# Patient Record
Sex: Female | Born: 2000 | Race: Black or African American | Hispanic: No | Marital: Single | Smoking: Never smoker
Health system: Southern US, Community
[De-identification: ages and names within clinical notes are randomized; demographics above are authoritative.]

## PROBLEM LIST (undated history)

## (undated) DIAGNOSIS — J45909 Unspecified asthma, uncomplicated: Secondary | ICD-10-CM

---
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
## 2020-01-31 ENCOUNTER — Other Ambulatory Visit: Payer: Self-pay

## 2020-01-31 ENCOUNTER — Emergency Department
Admission: EM | Admit: 2020-01-31 | Discharge: 2020-01-31 | Disposition: A | Discharge status: Home / Self Care | Payer: HRSA Program | Attending: Emergency Medicine | Admitting: Emergency Medicine

## 2020-01-31 ENCOUNTER — Encounter: Payer: Self-pay | Admitting: Emergency Medicine

## 2020-01-31 DIAGNOSIS — R519 Headache, unspecified: Secondary | ICD-10-CM | POA: Diagnosis present

## 2020-01-31 DIAGNOSIS — U071 COVID-19: Secondary | ICD-10-CM | POA: Insufficient documentation

## 2020-01-31 DIAGNOSIS — J45909 Unspecified asthma, uncomplicated: Secondary | ICD-10-CM | POA: Diagnosis not present

## 2020-01-31 LAB — RESP PANEL BY RT-PCR (FLU A&B, COVID) ARPGX2
Influenza A by PCR: NEGATIVE
Influenza B by PCR: NEGATIVE
SARS Coronavirus 2 by RT PCR: POSITIVE — AB

## 2020-01-31 NOTE — ED Notes (Signed)
Lab called POS covid result

## 2020-01-31 NOTE — ED Triage Notes (Signed)
States just wanted to come for a covid test.  C/O headache, back pain, cough, congestion, chills x 4

## 2020-01-31 NOTE — ED Notes (Signed)
Pt in with c/o body ache to only her back per pt, coughing, congestion, HA. Reports vomiting once today.  Denies diarrhea but reports "having the runs". Denies fever and SOB.
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
## 2020-02-01 ENCOUNTER — Telehealth: Payer: Self-pay | Admitting: Nurse Practitioner

## 2020-02-01 NOTE — Telephone Encounter (Signed)
Called to Discuss with patient about Covid symptoms and the use of the monoclonal antibody infusion for those with mild to moderate Covid symptoms and at a high risk of hospitalization.     Pt appears to qualify for this infusion due to co-morbid conditions and/or a member of an at-risk group in accordance with the FDA Emergency Use Authorization. BMI, asthma, high SVI    Unable to reach pt. Voicemail left and My Chart message sent.   Symptom onset: 12/25 Vaccinated: No  Qualified for Infusion: Yes  Willette Alma, NP [facility]  [phone]

## 2020-03-26 ENCOUNTER — Encounter: Payer: Self-pay | Admitting: Physician Assistant

## 2020-04-12 ENCOUNTER — Encounter: Payer: Self-pay | Admitting: Emergency Medicine

## 2020-04-12 ENCOUNTER — Emergency Department
Admission: EM | Admit: 2020-04-12 | Discharge: 2020-04-12 | Disposition: A | Discharge status: Home / Self Care | Payer: Self-pay | Attending: Emergency Medicine | Admitting: Emergency Medicine

## 2020-04-12 ENCOUNTER — Other Ambulatory Visit: Payer: Self-pay

## 2020-04-12 DIAGNOSIS — J45909 Unspecified asthma, uncomplicated: Secondary | ICD-10-CM | POA: Insufficient documentation

## 2020-04-12 DIAGNOSIS — J029 Acute pharyngitis, unspecified: Secondary | ICD-10-CM | POA: Insufficient documentation

## 2020-04-12 LAB — GROUP A STREP BY PCR: Group A Strep by PCR: NOT DETECTED

## 2020-04-12 MED ORDER — LIDOCAINE VISCOUS HCL 2 % MT SOLN
15.0000 mL | Freq: Once | OROMUCOSAL | Status: AC
  Administered 2020-04-12: 15 mL via OROMUCOSAL
  Filled 2020-04-12: qty 15

## 2020-04-12 MED ORDER — NYSTATIN 100000 UNIT/ML MT SUSP: 5.0000 mL | Freq: Four times a day (QID) | OROMUCOSAL | 0 refills | Status: DC | PRN
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
## 2020-04-12 NOTE — ED Triage Notes (Signed)
Pt comes into the ED via POV c/o sore throat x 2 days.  Pt has enlarged tonsils at this time.  Pt in NAD with even and unlabored respirations.

## 2020-04-12 NOTE — Discharge Instructions (Addendum)
Take OTC Zyrtec along with Sudafed. Use the magic mouthwash as needed. Follow-up with [facility].

## 2020-05-10 ENCOUNTER — Ambulatory Visit: Payer: Self-pay

## 2020-07-10 ENCOUNTER — Ambulatory Visit: Payer: Self-pay

## 2020-07-25 ENCOUNTER — Emergency Department
Admission: EM | Admit: 2020-07-25 | Discharge: 2020-07-25 | Disposition: A | Discharge status: Home / Self Care | Payer: Self-pay | Attending: Emergency Medicine | Admitting: Emergency Medicine

## 2020-07-25 ENCOUNTER — Other Ambulatory Visit: Payer: Self-pay

## 2020-07-25 ENCOUNTER — Encounter: Payer: Self-pay | Admitting: Emergency Medicine

## 2020-07-25 DIAGNOSIS — R21 Rash and other nonspecific skin eruption: Secondary | ICD-10-CM | POA: Insufficient documentation

## 2020-07-25 DIAGNOSIS — J45909 Unspecified asthma, uncomplicated: Secondary | ICD-10-CM | POA: Insufficient documentation

## 2020-07-25 MED ORDER — HYDROXYZINE HCL 50 MG PO TABS: 50.0000 mg | ORAL_TABLET | Freq: Three times a day (TID) | ORAL | 0 refills | Status: DC | PRN

## 2020-07-25 MED ORDER — METHYLPREDNISOLONE 4 MG PO TBPK: ORAL_TABLET | ORAL | 0 refills | Status: DC
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
## 2020-07-25 NOTE — ED Triage Notes (Signed)
Presents with rash this am   states she woke up with the rash this am   took benadryl 50 mg PTA

## 2020-07-25 NOTE — Discharge Instructions (Addendum)
Read and follow discharge care instruction.  Take medication as directed. 

## 2020-07-25 NOTE — ED Notes (Signed)
First Nurse Note: Pt c/o rash x 3 days

## 2020-10-18 ENCOUNTER — Other Ambulatory Visit: Payer: Self-pay

## 2020-10-18 ENCOUNTER — Emergency Department
Admission: EM | Admit: 2020-10-18 | Discharge: 2020-10-18 | Disposition: A | Discharge status: Home / Self Care | Payer: Self-pay | Attending: Emergency Medicine | Admitting: Emergency Medicine

## 2020-10-18 ENCOUNTER — Encounter: Payer: Self-pay | Admitting: Emergency Medicine

## 2020-10-18 DIAGNOSIS — M79651 Pain in right thigh: Secondary | ICD-10-CM | POA: Insufficient documentation

## 2020-10-18 DIAGNOSIS — Z5321 Procedure and treatment not carried out due to patient leaving prior to being seen by health care provider: Secondary | ICD-10-CM | POA: Insufficient documentation

## 2020-10-18 NOTE — ED Notes (Signed)
Pt called x 1 to be roomed.  No answer by patient.

## 2020-10-18 NOTE — ED Triage Notes (Signed)
Pt via POV from home. Pt c/o R leg pain. Pt states that it has been mildly bothering her the past 2 weeks but it got worse this AM. Pt states the pain is mostly in her thigh but radiates down her leg. Denies swelling. Pt is A&Ox4 and NAD

## 2020-10-18 NOTE — ED Notes (Signed)
Called patient no answer no answer at this time.

## 2020-11-06 ENCOUNTER — Emergency Department: Payer: Self-pay

## 2020-11-06 ENCOUNTER — Emergency Department
Admission: EM | Admit: 2020-11-06 | Discharge: 2020-11-07 | Disposition: A | Discharge status: Home / Self Care | Payer: Self-pay | Attending: Emergency Medicine | Admitting: Emergency Medicine

## 2020-11-06 ENCOUNTER — Other Ambulatory Visit: Payer: Self-pay

## 2020-11-06 DIAGNOSIS — J45909 Unspecified asthma, uncomplicated: Secondary | ICD-10-CM | POA: Insufficient documentation

## 2020-11-06 DIAGNOSIS — M940 Chondrocostal junction syndrome [Tietze]: Secondary | ICD-10-CM | POA: Insufficient documentation

## 2020-11-06 DIAGNOSIS — E876 Hypokalemia: Secondary | ICD-10-CM | POA: Insufficient documentation

## 2020-11-06 DIAGNOSIS — R079 Chest pain, unspecified: Secondary | ICD-10-CM | POA: Insufficient documentation

## 2020-11-06 LAB — CBC
HCT: 36.2 % (ref 36.0–46.0)
Hemoglobin: 12.5 g/dL (ref 12.0–15.0)
MCH: 30.7 pg (ref 26.0–34.0)
MCHC: 34.5 g/dL (ref 30.0–36.0)
MCV: 88.9 fL (ref 80.0–100.0)
Platelets: 306 10*3/uL (ref 150–400)
RBC: 4.07 MIL/uL (ref 3.87–5.11)
RDW: 13.2 % (ref 11.5–15.5)
WBC: 6.1 10*3/uL (ref 4.0–10.5)
nRBC: 0 % (ref 0.0–0.2)

## 2020-11-06 LAB — COMPREHENSIVE METABOLIC PANEL
ALT: 14 U/L (ref 0–44)
AST: 18 U/L (ref 15–41)
Albumin: 4.2 g/dL (ref 3.5–5.0)
Alkaline Phosphatase: 54 U/L (ref 38–126)
Anion gap: 8 (ref 5–15)
BUN: 13 mg/dL (ref 6–20)
CO2: 24 mmol/L (ref 22–32)
Calcium: 9.3 mg/dL (ref 8.9–10.3)
Chloride: 105 mmol/L (ref 98–111)
Creatinine, Ser: 0.66 mg/dL (ref 0.44–1.00)
GFR, Estimated: >60 mL/min (ref >60)
Glucose, Bld: 95 mg/dL (ref 70–99)
Potassium: 3.2 mmol/L — ABNORMAL LOW (ref 3.5–5.1)
Sodium: 137 mmol/L (ref 135–145)
Total Bilirubin: 0.9 mg/dL (ref 0.3–1.2)
Total Protein: 7.2 g/dL (ref 6.5–8.1)

## 2020-11-06 LAB — TROPONIN I (HIGH SENSITIVITY): Troponin I (High Sensitivity): <2 ng/L (ref <18)

## 2020-11-06 MED ORDER — KETOROLAC TROMETHAMINE 60 MG/2ML IM SOLN
30.0000 mg | Freq: Once | INTRAMUSCULAR | Status: AC
  Administered 2020-11-07: 30 mg via INTRAMUSCULAR
  Filled 2020-11-06: qty 2

## 2020-11-06 MED ORDER — POTASSIUM CHLORIDE CRYS ER 20 MEQ PO TBCR
40.0000 meq | EXTENDED_RELEASE_TABLET | Freq: Once | ORAL | Status: AC
  Administered 2020-11-07: 40 meq via ORAL
  Filled 2020-11-06: qty 2
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
## 2020-11-06 NOTE — ED Triage Notes (Signed)
Pt in with co central chest pain for few months. States it is intermittent but no precipitated by activity or movement. Pt states does radiates through to back at times.

## 2020-11-07 LAB — LIPASE, BLOOD: Lipase: 23 U/L (ref 11–51)

## 2020-11-07 MED ORDER — NAPROXEN 500 MG PO TABS: 500.0000 mg | ORAL_TABLET | Freq: Two times a day (BID) | ORAL | 0 refills | Status: DC

## 2020-11-07 NOTE — Discharge Instructions (Signed)
You may take Naprosyn as needed for chest discomfort.  Apply moist heat to affected area several times daily.  Return to the ER for worsening symptoms, persistent vomiting, difficulty breathing or other concerns.

## 2020-11-29 ENCOUNTER — Ambulatory Visit: Payer: Self-pay

## 2021-02-02 ENCOUNTER — Ambulatory Visit: Payer: Self-pay

## 2022-01-20 IMAGING — CR DG CHEST 2V
1 series · 2 of 2 positions shown · non-contrast
Comparison: 11/07/2015

CLINICAL DATA: Chest pain

EXAM:
CHEST - 2 VIEW

[Series 1: dg chest 2 view · 0.14mm/px · 2 of 2 slices shown]
[im 1/2]
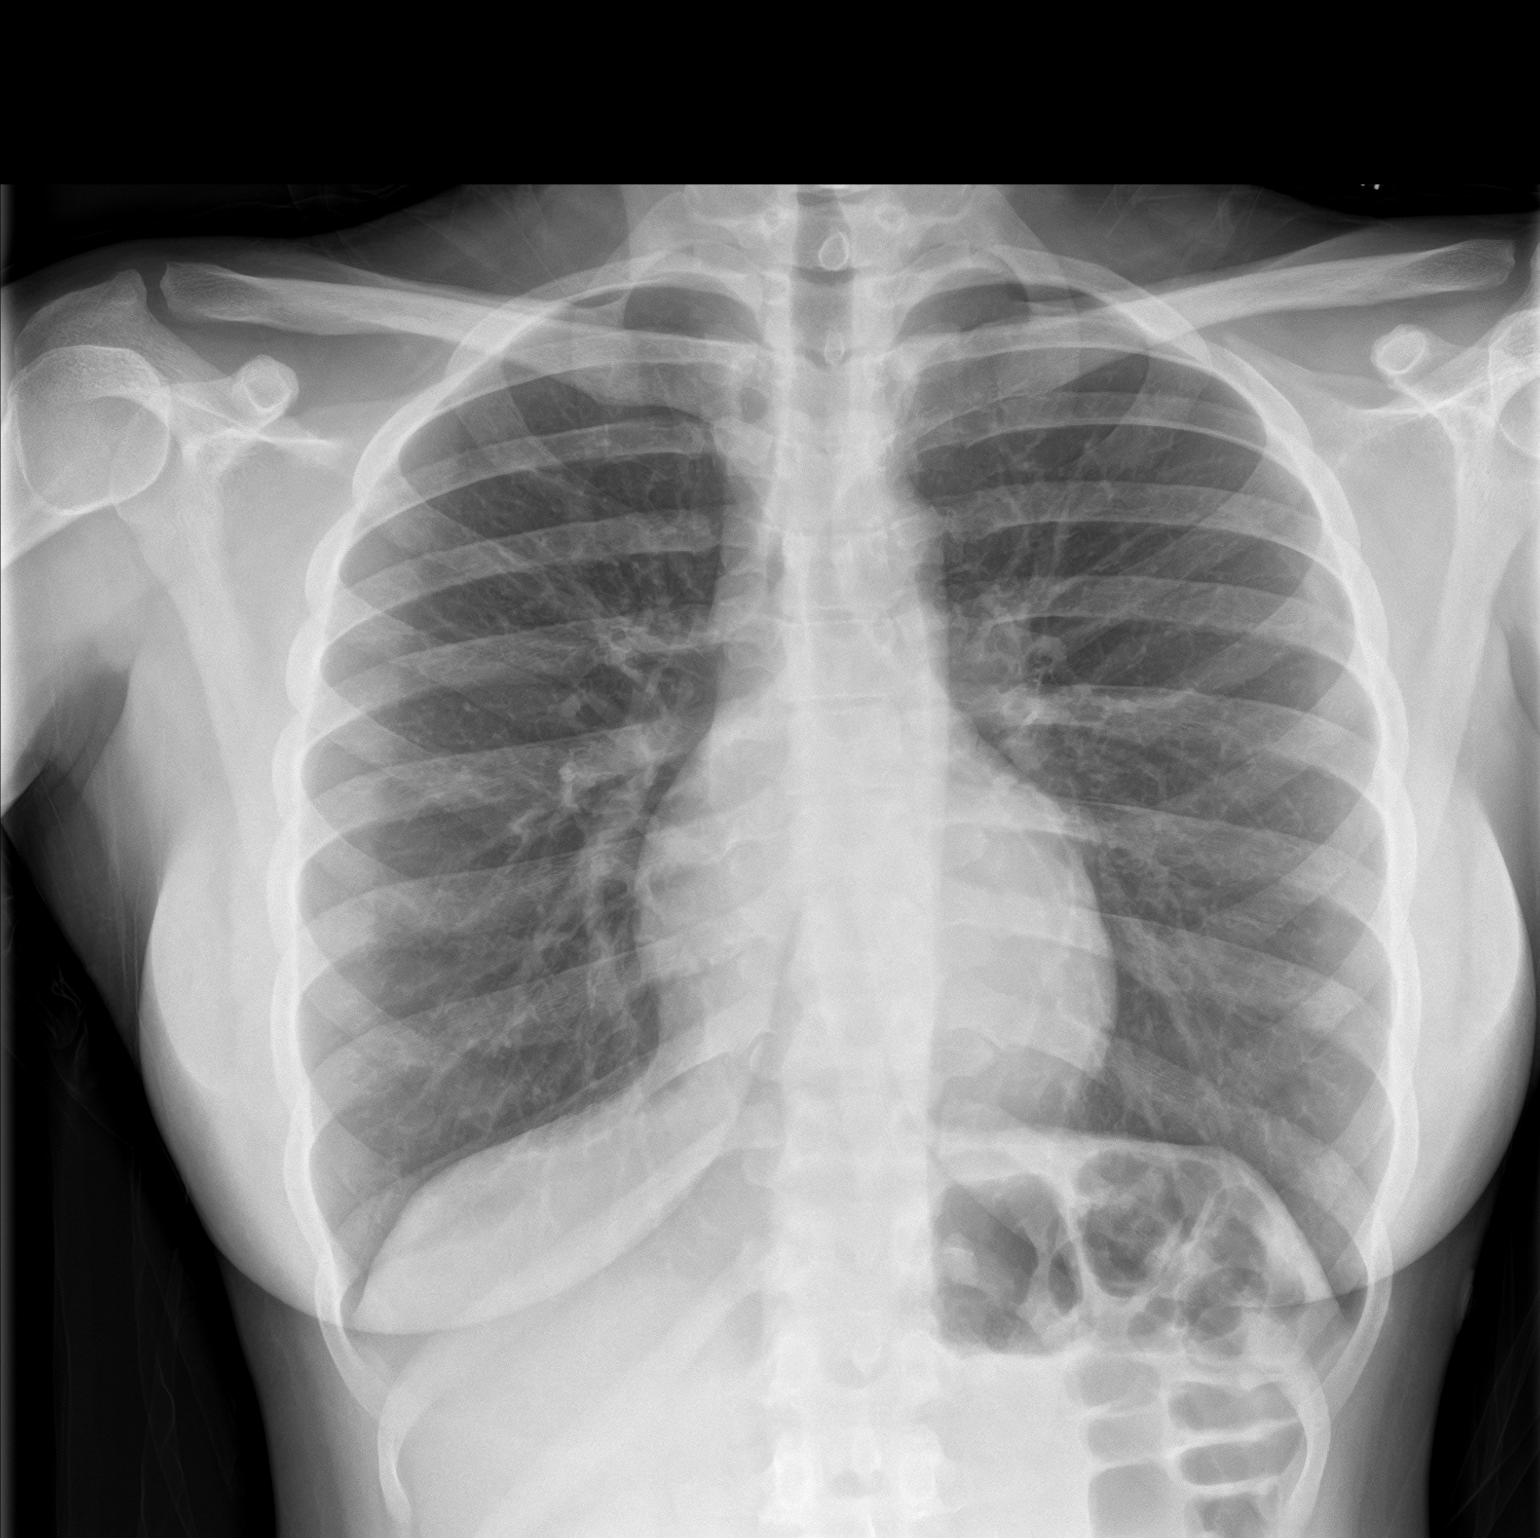
[im 2/2]
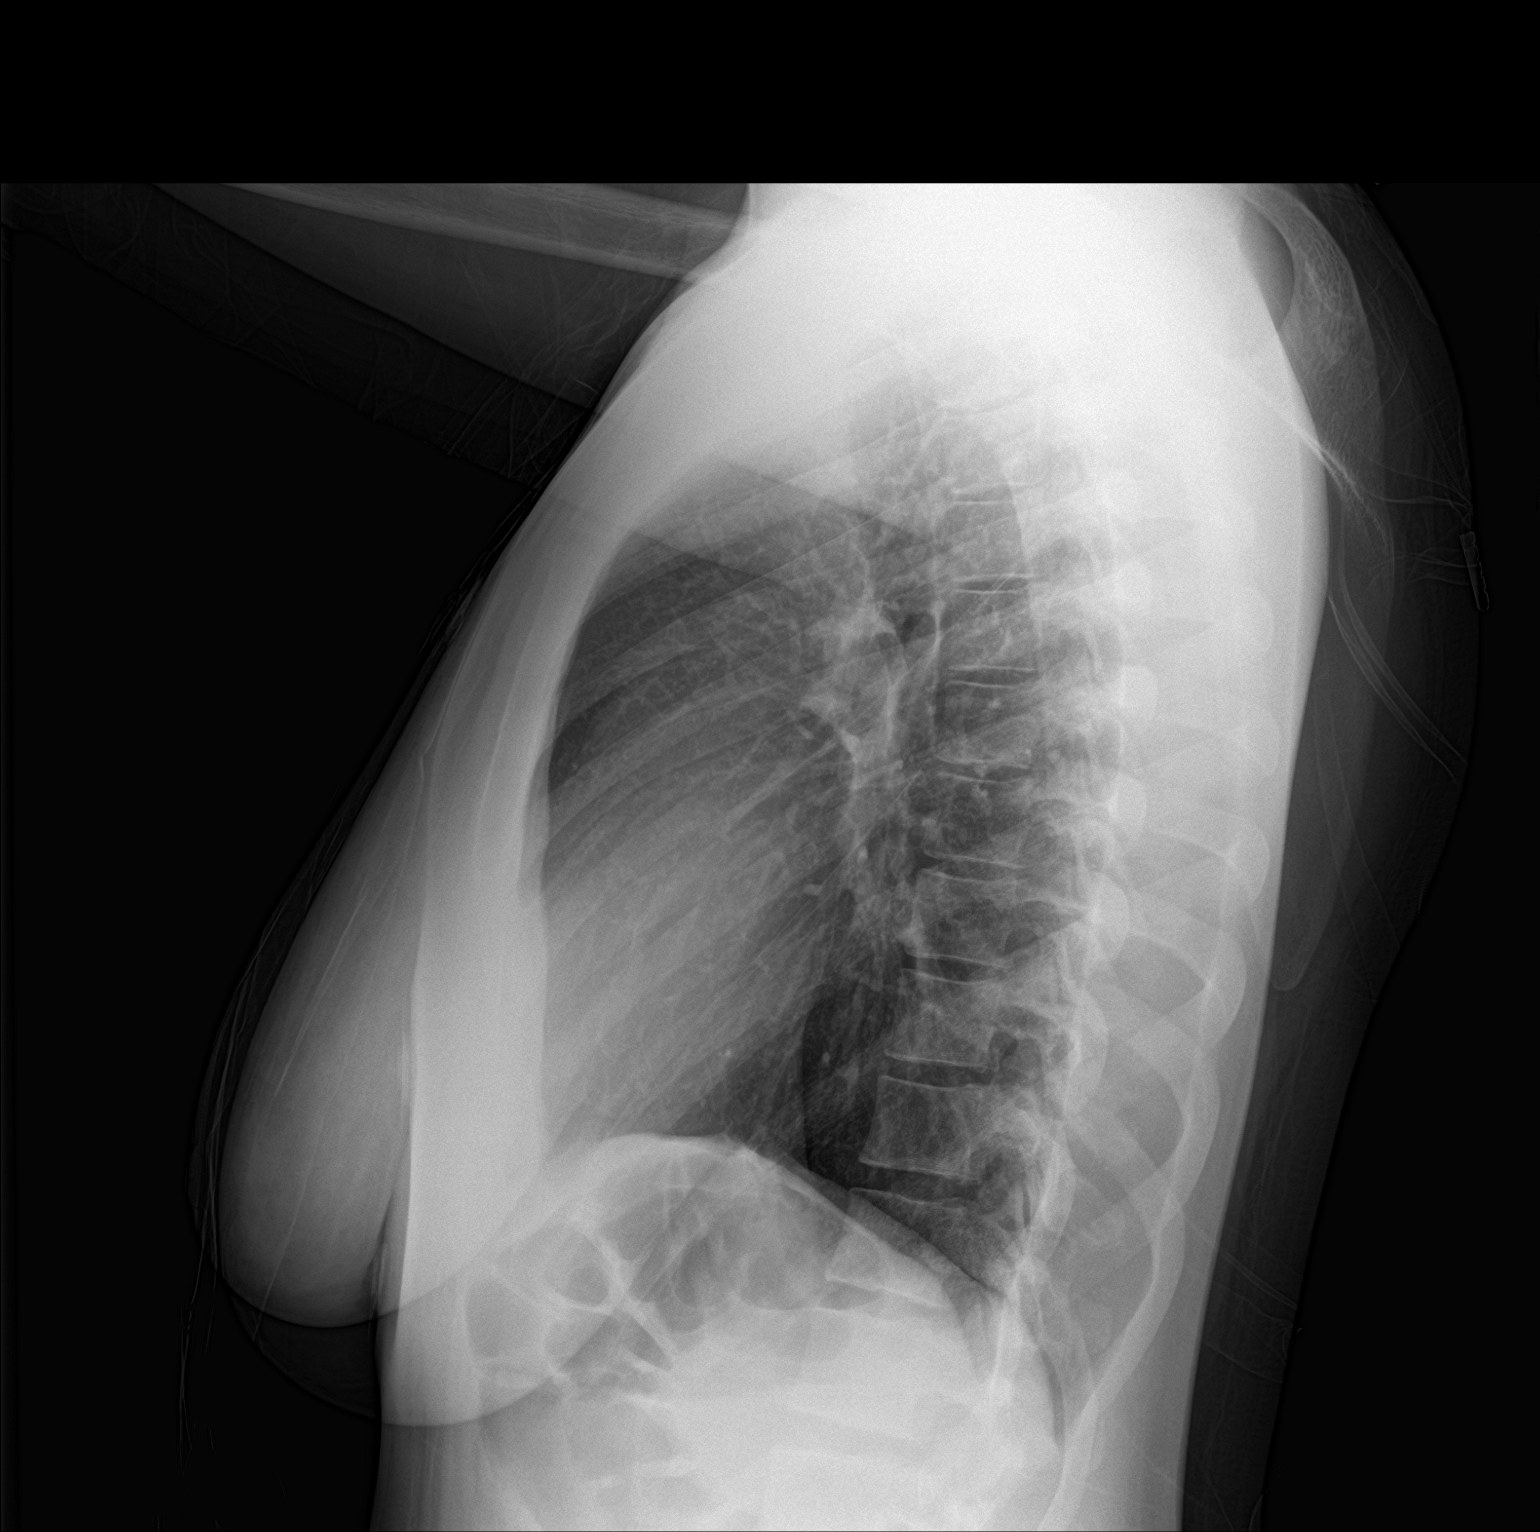

[2 of 2 positions shown; findings below may reference images not displayed]

FINDINGS: The heart size and mediastinal contours are within normal limits.
Both lungs are clear. The visualized skeletal structures are
unremarkable.
IMPRESSION: No active cardiopulmonary disease.

## 2022-04-26 ENCOUNTER — Emergency Department
Admission: EM | Admit: 2022-04-26 | Discharge: 2022-04-26 | Disposition: A | Discharge status: Home / Self Care | Payer: Self-pay | Attending: Emergency Medicine | Admitting: Emergency Medicine

## 2022-04-26 ENCOUNTER — Other Ambulatory Visit: Payer: Self-pay

## 2022-04-26 DIAGNOSIS — Z20822 Contact with and (suspected) exposure to covid-19: Secondary | ICD-10-CM | POA: Insufficient documentation

## 2022-04-26 DIAGNOSIS — J020 Streptococcal pharyngitis: Secondary | ICD-10-CM | POA: Insufficient documentation

## 2022-04-26 LAB — GROUP A STREP BY PCR: Group A Strep by PCR: DETECTED — AB

## 2022-04-26 LAB — RESP PANEL BY RT-PCR (RSV, FLU A&B, COVID)  RVPGX2
Influenza A by PCR: NEGATIVE
Influenza B by PCR: NEGATIVE
Resp Syncytial Virus by PCR: NEGATIVE
SARS Coronavirus 2 by RT PCR: NEGATIVE

## 2022-04-26 MED ORDER — AMOXICILLIN 875 MG PO TABS: 875.0000 mg | ORAL_TABLET | Freq: Two times a day (BID) | ORAL | 0 refills | Status: DC

## 2022-04-26 NOTE — Discharge Instructions (Addendum)
Gargle with warm salt water Take antibiotic as prescribed Discard your toothbrush in 3 days You are contagious until you have had 24 hours of antibiotics

## 2022-04-26 NOTE — ED Provider Notes (Signed)
[facility] Provider Note    Event Date/Time   First MD Initiated Contact with Patient 04/26/22 [phone]     (approximate)   History   Sore Throat   HPI  Lynn Ramsey is a 22 y.o. female with no significant past medical history presents emergency department with sore throat for 4 days.  Patient states she usually gets a throat infection about once a year.  No fever or chills.  No cough or congestion      Physical Exam   Triage Vital Signs: ED Triage Vitals [04/26/22 0750]  Enc Vitals Group     BP (!) 120/98     Pulse Rate 98     Resp 17     Temp 98.1 F (36.7 C)     Temp Source Oral     SpO2 98 %     Weight      Height      Head Circumference      Peak Flow      Pain Score 6     Pain Loc      Pain Edu?      Excl. in [place]?     Most recent vital signs: Vitals:   04/26/22 0750  BP: (!) 120/98  Pulse: 98  Resp: 17  Temp: 98.1 F (36.7 C)  SpO2: 98%     General: Awake, no distress.   CV:  Good peripheral perfusion. regular rate and  rhythm Resp:  Normal effort. Lungs cta Abd:  No distention.   Other:      ED Results / Procedures / Treatments   Labs (all labs ordered are listed, but only abnormal results are displayed) Labs Reviewed  GROUP A STREP BY PCR - Abnormal; Notable for the following components:      Result Value   Group A Strep by PCR DETECTED (*)    All other components within normal limits  RESP PANEL BY RT-PCR (RSV, FLU A&B, COVID)  [id number]     EKG     RADIOLOGY     PROCEDURES:   Procedures   MEDICATIONS ORDERED IN ED: Medications - No data to display   IMPRESSION / MDM / [place] / ED COURSE  I reviewed the triage vital signs and the nursing notes.                              Differential diagnosis includes, but is not limited to, strep throat, COVID, influenza  Patient's presentation is most consistent with acute complicated illness / injury requiring diagnostic workup.    Strep test positive  Will place patient on amoxicillin.  She is to follow-up with her regular doctor if not improving 3 days.  Return if worsening.  Prescription for amoxicillin twice daily for 10 days.  Discard her toothbrush in 3 days.  Patient is in agreement treatment plan.  Discharged stable condition.  I did explain her she can see her respiratory panel results on [place] MyChart.  This will not change our treatment at this time      FINAL CLINICAL IMPRESSION(S) / ED DIAGNOSES   Final diagnoses:  Acute streptococcal pharyngitis     Rx / DC Orders   ED Discharge Orders          Ordered    amoxicillin (AMOXIL) 875 MG tablet  2 times daily        04/26/22 0830  Note:  This document was prepared using Dragon voice recognition software and may include unintentional dictation errors.    Versie Starks, PA-C 04/26/22 [id number]    Blake Divine, MD 04/28/22 [phone]

## 2022-04-26 NOTE — ED Triage Notes (Signed)
Pt presents to ED with /co of a sore throat. NAD noted.

## 2022-07-18 ENCOUNTER — Ambulatory Visit: Payer: Self-pay

## 2023-03-13 ENCOUNTER — Encounter: Payer: Self-pay | Admitting: Intensive Care

## 2023-03-13 ENCOUNTER — Emergency Department
Admission: EM | Admit: 2023-03-13 | Discharge: 2023-03-13 | Disposition: A | Discharge status: Home / Self Care | Payer: Self-pay | Attending: Emergency Medicine | Admitting: Emergency Medicine

## 2023-03-13 ENCOUNTER — Other Ambulatory Visit: Payer: Self-pay

## 2023-03-13 DIAGNOSIS — L02412 Cutaneous abscess of left axilla: Secondary | ICD-10-CM | POA: Insufficient documentation

## 2023-03-13 DIAGNOSIS — L0291 Cutaneous abscess, unspecified: Secondary | ICD-10-CM

## 2023-03-13 DIAGNOSIS — L02415 Cutaneous abscess of right lower limb: Secondary | ICD-10-CM | POA: Insufficient documentation

## 2023-03-13 MED ORDER — CEPHALEXIN 500 MG PO CAPS: 500.0000 mg | ORAL_CAPSULE | Freq: Two times a day (BID) | ORAL | 0 refills | Status: AC

## 2023-03-13 MED ORDER — LIDOCAINE HCL (PF) 1 % IJ SOLN
5.0000 mL | Freq: Once | INTRAMUSCULAR | Status: AC
  Administered 2023-03-13: 5 mL
  Filled 2023-03-13: qty 5

## 2023-03-13 NOTE — Discharge Instructions (Addendum)
 Your evaluated in the ED for an abscess.  An I&D was performed on 1 abscess in which was unsuccessful with complete drainage.  We will try antibiotics for residual bacteria.  Please take antibiotics as prescribed and until dose is complete even if symptoms improve.  Please review incision and drainage education packet attached to your discharge papers.  You are encouraged to use warm sitz bath's twice daily only coating the affected area.  Take Tylenol and ibuprofen  OTC as needed for pain.  Schedule appointment with OB/GYN.

## 2023-03-13 NOTE — ED Triage Notes (Signed)
 Patient reports abscess on right thigh and under left axilla. History same

## 2023-03-13 NOTE — ED Provider Notes (Signed)
 [facility] Emergency Department Provider Note     Event Date/Time   First MD Initiated Contact with Patient 03/13/23 [phone]     (approximate)   History   Abscess   HPI  Lynn Ramsey is a 23 y.o. female presents to the ED with complaint of a painful abscess to her right thigh and left axilla. She has a history of the same that she reports spontaneously drain on their own. The abscess on her right thigh is currently draining purulent discharge. Denies fever and chills.     Physical Exam   Triage Vital Signs: ED Triage Vitals [03/13/23 0906]  Encounter Vitals Group     BP (!) 133/93     Systolic BP Percentile      Diastolic BP Percentile      Pulse Rate 98     Resp 18     Temp 98.4 F (36.9 C)     Temp Source Oral     SpO2 100 %     Weight 239 lb (108.4 kg)     Height 5' 8 (1.727 m)     Head Circumference      Peak Flow      Pain Score 7     Pain Loc      Pain Education      Exclude from Growth Chart     Most recent vital signs: Vitals:   03/13/23 0906  BP: (!) 133/93  Pulse: 98  Resp: 18  Temp: 98.4 F (36.9 C)  SpO2: 100%    General Awake, no distress.  HEENT NCAT. PERRL. EOMI. No rhinorrhea. Mucous membranes are moist.  CV:  Good peripheral perfusion.  RESP:  Normal effort.  ABD:  No distention.  Other:  ~ 3 cm fluctuant mass to superior-medial right thigh. Purulent drainage noted. ~ 2 cm mobile, fluctuant mass to left superior axillary.    ED Results / Procedures / Treatments   Labs (all labs ordered are listed, but only abnormal results are displayed) Labs Reviewed - No data to display  No results found.  PROCEDURES:  Critical Care performed: No  .Incision and Drainage  Date/Time: 03/13/2023 10:31 AM  Performed by: Margrette Rebbeca LABOR, PA-C Authorized by: Margrette Rebbeca LABOR, PA-C   Consent:    Consent obtained:  Verbal   Consent given by:  Patient   Risks discussed:  Bleeding, incomplete drainage and  pain Universal protocol:    Patient identity confirmed:  Verbally with patient Location:    Type:  Abscess Pre-procedure details:    Skin preparation:  Povidone-iodine Procedure details:    Incision types:  Stab incision   Incision depth:  Dermal   Drainage:  Bloody   Drainage amount:  Scant   Wound treatment:  Wound left open   Packing materials:  None Post-procedure details:    Procedure completion:  Procedure terminated at patient's request Comments:     Appropriate gauze dressing placed.  MEDICATIONS ORDERED IN ED: Medications  lidocaine  (PF) (XYLOCAINE ) 1 % injection 5 mL (5 mLs Infiltration Given by Other 03/13/23 1003)     IMPRESSION / MDM / ASSESSMENT AND PLAN / ED COURSE  I reviewed the triage vital signs and the nursing notes.                                23 y.o. female presents to the emergency department for evaluation and treatment  of abscess. See HPI for further details.   Differential diagnosis includes, but is not limited to at HS flare, abscess, cyst, folliculitis, cellulitis  Patient's presentation is most consistent with acute complicated illness / injury requiring diagnostic workup.  Patient is alert and oriented.  She is hemodynamically stable and afebrile.  Physical exam findings are stated above.  Please see procedure note for incision and drainage procedure.  incomplete drainage due to patient tolerance.  No incision made to the left axillary abscess due to patient request.  She would like to trial antibiotics.  I encouraged her to continue sitz bath's and to take Tylenol and ibuprofen  for pain as needed.  ED return precautions are discussed.  Encouraged monitoring for systemic symptoms including fever, increase in tenderness, spread of redness and abnormal discharge.  I encouraged her to follow-up with OB/GYN as she has not established this care since being in [place].  Her symptoms are clinically consistent with at bedtime but no complete diagnosis  has been made.  Patient verbalized understanding.  Patient is a stable condition for discharge home.  All questions concerns were addressed during this ED visit.  FINAL CLINICAL IMPRESSION(S) / ED DIAGNOSES   Final diagnoses:  Abscess    Rx / DC Orders   ED Discharge Orders          Ordered    cephALEXin  (KEFLEX ) 500 MG capsule  2 times daily        03/13/23 1021             Note:  This document was prepared using Dragon voice recognition software and may include unintentional dictation errors.    Margrette, Said Rueb A, PA-C 03/13/23 1144    Ernest Ronal BRAVO, MD 03/14/23 [phone]

## 2023-08-26 ENCOUNTER — Telehealth: Payer: Self-pay | Admitting: Nurse Practitioner

## 2023-08-26 DIAGNOSIS — L732 Hidradenitis suppurativa: Secondary | ICD-10-CM

## 2023-08-26 MED ORDER — DOXYCYCLINE HYCLATE 100 MG PO TABS: 100.0000 mg | ORAL_TABLET | Freq: Two times a day (BID) | ORAL | 0 refills | Status: DC

## 2023-08-26 NOTE — Patient Instructions (Addendum)
 Suspected HS advised follow up with the health department for further workup and treatment   [address] [place], [place] [address] Phone: [phone]
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
## 2023-08-28 ENCOUNTER — Telehealth: Payer: Self-pay | Admitting: Physician Assistant

## 2023-08-28 DIAGNOSIS — Z789 Other specified health status: Secondary | ICD-10-CM

## 2023-08-28 MED ORDER — SULFAMETHOXAZOLE-TRIMETHOPRIM 800-160 MG PO TABS: 1.0000 | ORAL_TABLET | Freq: Two times a day (BID) | ORAL | 0 refills | Status: DC

## 2023-08-28 MED ORDER — ONDANSETRON 4 MG PO TBDP: 4.0000 mg | ORAL_TABLET | Freq: Three times a day (TID) | ORAL | 0 refills | Status: DC | PRN
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
## 2023-08-28 NOTE — Patient Instructions (Signed)
  Lynn Ramsey, thank you for joining Lynn Velma Lunger, PA-C for today's virtual visit.  While this provider is not your primary care provider (PCP), if your PCP is located in our provider database this encounter information will be shared with them immediately following your visit.   A [facility] MyChart account gives you access to today's visit and all your visits, tests, and labs performed at [facility]  click here if you don't have a [facility] MyChart account or go to [email or web address]  Consent: (Patient) Lynn Ramsey provided verbal consent for this virtual visit at the beginning of the encounter.  Current Medications:  Current Outpatient Medications:    doxycycline  (VIBRA -TABS) 100 MG tablet, Take 1 tablet (100 mg total) by mouth 2 (two) times daily for 10 days., Disp: 20 tablet, Rfl: 0   Medications ordered in this encounter:  No orders of the defined types were placed in this encounter.    *If you need refills on other medications prior to your next appointment, please contact your pharmacy*  Follow-Up: Call back or seek an in-person evaluation if the symptoms worsen or if the condition fails to improve as anticipated.  [facility] [phone]  Other Instructions Stop Doxycycline .  I will add to your allergy/intolerances list.  Use the Zofran  to help with lingering nausea.  Start Bactrim  per orders for you HS.  If you note any non-resolving, new, or worsening symptoms despite treatment, please seek an in-person evaluation ASAP.    If you have been instructed to have an in-person evaluation today at a local Urgent Care facility, please use the link below. It will take you to a list of all of our available [facility], including address, phone number and hours of operation. Please do not delay care.  [facility]  If you or a family member do not have a primary care provider, use the link below to  schedule a visit and establish care. When you choose a [facility] primary care physician or advanced practice provider, you gain a long-term partner in health. Find a Primary Care Provider  Learn more about [facility] in-office and virtual care options: [facility] - Get Care Now

## 2023-09-03 ENCOUNTER — Ambulatory Visit: Payer: Self-pay

## 2023-09-12 ENCOUNTER — Ambulatory Visit: Payer: Self-pay

## 2024-01-07 ENCOUNTER — Ambulatory Visit: Payer: Self-pay

## 2024-01-07 DIAGNOSIS — Z3009 Encounter for other general counseling and advice on contraception: Secondary | ICD-10-CM

## 2024-01-07 DIAGNOSIS — Z113 Encounter for screening for infections with a predominantly sexual mode of transmission: Secondary | ICD-10-CM

## 2024-01-07 DIAGNOSIS — R4586 Emotional lability: Secondary | ICD-10-CM

## 2024-01-07 LAB — WET PREP FOR TRICH, YEAST, CLUE
Clue Cell Exam: NEGATIVE
Trichomonas Exam: NEGATIVE
Yeast Exam: NEGATIVE
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
## 2024-01-07 NOTE — Progress Notes (Signed)
 Pt is here for PE. Wet prep results reviewed with patient and requires no treatment per SO. Opportunity given to patient to ask questions for any clarifications. Questions answered.  Condoms declined and FP card given. Kwadwo Malikah Principato,RN.
